# Patient Record
Sex: Male | Born: 1990 | Race: White | Hispanic: No | Marital: Single | Smoking: Former smoker
Health system: Southern US, Community
[De-identification: ages and names within clinical notes are randomized; demographics above are authoritative.]

## PROBLEM LIST (undated history)

## (undated) DIAGNOSIS — G43909 Migraine, unspecified, not intractable, without status migrainosus: Secondary | ICD-10-CM

---

## 2013-09-02 ENCOUNTER — Emergency Department (HOSPITAL_BASED_OUTPATIENT_CLINIC_OR_DEPARTMENT_OTHER)
Admission: EM | Admit: 2013-09-02 | Discharge: 2013-09-03 | Disposition: A | Discharge status: Home / Self Care | Payer: BC Managed Care – PPO | Attending: Emergency Medicine | Admitting: Emergency Medicine

## 2013-09-02 ENCOUNTER — Encounter (HOSPITAL_BASED_OUTPATIENT_CLINIC_OR_DEPARTMENT_OTHER): Payer: Self-pay | Admitting: Emergency Medicine

## 2013-09-02 DIAGNOSIS — Z79899 Other long term (current) drug therapy: Secondary | ICD-10-CM | POA: Insufficient documentation

## 2013-09-02 DIAGNOSIS — R1084 Generalized abdominal pain: Secondary | ICD-10-CM | POA: Insufficient documentation

## 2013-09-02 DIAGNOSIS — M545 Low back pain, unspecified: Secondary | ICD-10-CM | POA: Insufficient documentation

## 2013-09-02 DIAGNOSIS — F17200 Nicotine dependence, unspecified, uncomplicated: Secondary | ICD-10-CM | POA: Insufficient documentation

## 2013-09-02 DIAGNOSIS — R109 Unspecified abdominal pain: Secondary | ICD-10-CM

## 2013-09-02 DIAGNOSIS — G43909 Migraine, unspecified, not intractable, without status migrainosus: Secondary | ICD-10-CM | POA: Insufficient documentation

## 2013-09-02 HISTORY — DX: Migraine, unspecified, not intractable, without status migrainosus: G43.909

## 2013-09-02 LAB — URINALYSIS, ROUTINE W REFLEX MICROSCOPIC
Bilirubin Urine: NEGATIVE
Glucose, UA: NEGATIVE mg/dL
Nitrite: NEGATIVE
Protein, ur: 100 mg/dL — AB
Specific Gravity, Urine: 1.009 (ref 1.005–1.030)
Urobilinogen, UA: 0.2 mg/dL (ref 0.0–1.0)
pH: 5.5 (ref 5.0–8.0)

## 2013-09-02 LAB — URINE MICROSCOPIC-ADD ON

## 2013-09-02 MED ORDER — GI COCKTAIL ~~LOC~~
30.0000 mL | Freq: Once | ORAL | Status: AC
  Administered 2013-09-02: 30 mL via ORAL
  Filled 2013-09-02: qty 30

## 2013-09-02 NOTE — ED Provider Notes (Signed)
CSN: [id number]     Arrival date & time 09/02/13  2245 History   None    This chart was scribed for Hanley Seamen, MD by Arlan Organ, ED Scribe. This patient was seen in room MH12/MH12 and the patient's care was started 11:51 PM.   Chief Complaint  Patient presents with  . Abdominal Pain   HPI  HPI Comments: Zachary Evans is a 22 y.o. male who presents to the Emergency Department complaining of gradual onset, gradually worsening, constant generalized abdominal pain that initially started around 5:30 this evening. Pt also reports lower back pain that is brought on when he is lying flat. He currently rates his pain 7/10. He says movements worsens his pain, and nothing makes the pain better. Denies fever, chills, nausea, vomiting, constipation, diarrhea, dysuria, hematuria, SOB, or CP. He denies any previous similar episodes.  Past Medical History  Diagnosis Date  . Migraine    History reviewed. No pertinent past surgical history. History reviewed. No pertinent family history. History  Substance Use Topics  . Smoking status: Current Every Day Smoker  . Smokeless tobacco: Not on file  . Alcohol Use: No    Review of Systems  All other systems reviewed and are negative.    Allergies  Septra  Home Medications   Current Outpatient Rx  Name  Route  Sig  Dispense  Refill  . amitriptyline (ELAVIL) 10 MG tablet   Oral   Take 30 mg by mouth at bedtime.         . nadolol (CORGARD) 80 MG tablet   Oral   Take 80 mg by mouth daily.          Triage Vitals: BP 144/86  Pulse 67  Temp(Src) 98.9 F (37.2 C) (Oral)  Resp 16  Wt 128 lb (58.06 kg)  SpO2 100%  Physical Exam  General: Well-developed, well-nourished male in no acute distress; appearance consistent with age of record HENT: normocephalic; atraumatic Eyes: pupils equal, round and reactive to light; extraocular muscles intact Neck: supple Heart: regular rate and rhythm; no murmurs, rubs or gallops Lungs: clear to  auscultation bilaterally Abdomen: soft; nondistended; no masses or hepatosplenomegaly; bowel sounds present; tenderness to upper abdomen greater in LUQ Extremities: No deformity; full range of motion; pulses normal Neurologic: Awake, alert and oriented; motor function intact in all extremities and symmetric; no facial droop Skin: Warm and dry Psychiatric: Normal mood and affect   ED Course  Procedures (including critical care time)  DIAGNOSTIC STUDIES: Oxygen Saturation is 100% on RA, Normal by my interpretation.    COORDINATION OF CARE: 11:46 PM- Will order urinalysis. Discussed treatment plan with pt at bedside and pt agreed to plan.      MDM   Nursing notes and vitals signs, including pulse oximetry, reviewed.  Summary of this visit's results, reviewed by myself:  Labs:  Results for orders placed during the hospital encounter of 09/02/13 (from the past 24 hour(s))  URINALYSIS, ROUTINE W REFLEX MICROSCOPIC     Status: Abnormal   Collection Time    09/02/13 10:57 PM      Result Value Range   Color, Urine YELLOW  YELLOW   APPearance CLEAR  CLEAR   Specific Gravity, Urine 1.009  1.005 - 1.030   pH 5.5  5.0 - 8.0   Glucose, UA NEGATIVE  NEGATIVE mg/dL   Hgb urine dipstick SMALL (*) NEGATIVE   Bilirubin Urine NEGATIVE  NEGATIVE   Ketones, ur NEGATIVE  NEGATIVE mg/dL  Protein, ur 100 (*) NEGATIVE mg/dL   Urobilinogen, UA 0.2  0.0 - 1.0 mg/dL   Nitrite NEGATIVE  NEGATIVE   Leukocytes, UA NEGATIVE  NEGATIVE  URINE MICROSCOPIC-ADD ON     Status: Abnormal   Collection Time    09/02/13 10:57 PM      Result Value Range   Squamous Epithelial / LPF FEW (*) RARE   WBC, UA 0-2  <3 WBC/hpf   RBC / HPF 3-6  <3 RBC/hpf   Bacteria, UA FEW (*) RARE   Casts HYALINE CASTS (*) NEGATIVE  LIPASE, BLOOD     Status: None   Collection Time    09/03/13 12:35 AM      Result Value Range   Lipase 20  11 - 59 U/L  COMPREHENSIVE METABOLIC PANEL     Status: Abnormal   Collection Time     09/03/13 12:35 AM      Result Value Range   Sodium 139  135 - 145 mEq/L   Potassium 4.0  3.5 - 5.1 mEq/L   Chloride 102  96 - 112 mEq/L   CO2 27  19 - 32 mEq/L   Glucose, Bld 115 (*) 70 - 99 mg/dL   BUN 14  6 - 23 mg/dL   Creatinine, Ser 8.65  0.50 - 1.35 mg/dL   Calcium 78.4  8.4 - 69.6 mg/dL   Total Protein 7.1  6.0 - 8.3 g/dL   Albumin 4.3  3.5 - 5.2 g/dL   AST 17  0 - 37 U/L   ALT 17  0 - 53 U/L   Alkaline Phosphatase 61  39 - 117 U/L   Total Bilirubin 0.2 (*) 0.3 - 1.2 mg/dL   GFR calc non Af Amer 77 (*) >90 mL/min   GFR calc Af Amer 89 (*) >90 mL/min  CBC WITH DIFFERENTIAL     Status: Abnormal   Collection Time    09/03/13 12:35 AM      Result Value Range   WBC 8.9  4.0 - 10.5 K/uL   RBC 4.57  4.22 - 5.81 MIL/uL   Hemoglobin 13.4  13.0 - 17.0 g/dL   HCT 29.5  28.4 - 13.2 %   MCV 86.4  78.0 - 100.0 fL   MCH 29.3  26.0 - 34.0 pg   MCHC 33.9  30.0 - 36.0 g/dL   RDW 44.0  10.2 - 72.5 %   Platelets 146 (*) 150 - 400 K/uL   Neutrophils Relative % 69  43 - 77 %   Neutro Abs 6.2  1.7 - 7.7 K/uL   Lymphocytes Relative 20  12 - 46 %   Lymphs Abs 1.8  0.7 - 4.0 K/uL   Monocytes Relative 10  3 - 12 %   Monocytes Absolute 0.9  0.1 - 1.0 K/uL   Eosinophils Relative 1  0 - 5 %   Eosinophils Absolute 0.1  0.0 - 0.7 K/uL   Basophils Relative 0  0 - 1 %   Basophils Absolute 0.0  0.0 - 0.1 K/uL   12:27 AM No change with GI cocktail.  Imaging Studies: Ct Abdomen Pelvis W Contrast  09/03/2013   CLINICAL DATA:  Generalized abdominal pain  EXAM: CT ABDOMEN AND PELVIS WITH CONTRAST  TECHNIQUE: Multidetector CT imaging of the abdomen and pelvis was performed using the standard protocol following bolus administration of intravenous contrast.  CONTRAST:  50mL OMNIPAQUE IOHEXOL 300 MG/ML SOLN, OMNIPAQUE IOHEXOL 300 MG/ML SOLN  COMPARISON:  None.  FINDINGS: The visualized lung bases are clear.  The liver demonstrates a normal contrast enhanced appearance. No focal intrahepatic lesions.  Gallbladder is normal. No biliary ductal dilatation. The spleen, adrenal glands, and pancreas demonstrate a normal contrast enhanced appearance.  The kidneys are equal in size with symmetric enhancement. No nephrolithiasis, hydronephrosis, or focal enhancing renal mass identified.  There is no evidence of bowel obstruction. The stomach, duodenum, and small bowel are unremarkable. The colon is within normal limits without evidence of abnormal wall thickening, mucosal enhancement, or inflammatory fat stranding. The appendix is visualize in the right lower quadrant and is of normal caliber and appearance without associated inflammatory changes to suggest acute appendicitis.  Bladder is well distended and normal in appearance. Prostate is unremarkable.  No free air or fluid identified. No pathologically enlarged intra-abdominal or pelvic lymph nodes are seen.  No acute osseous abnormality identified. No focal lytic or blastic osseous lesions.  IMPRESSION: 1. No CT evidence of acute intra-abdominal or pelvic process. 2. Normal appendix.   Electronically Signed   By: Rise Mu M.D.   On: 09/03/2013 03:19   3:25 AM Patient advised of essentially normal CT and laboratory studies. A specific cause of his abdominal pain is not apparent. His pain has improved in the ED and is now principally epigastric; he has some mild epigastric tenderness remaining.   I personally performed the services described in this documentation, which was scribed in my presence.  The recorded information has been reviewed and is accurate.  Hanley Seamen, MD 09/03/13 [phone]

## 2013-09-02 NOTE — ED Notes (Signed)
Generalized abd pain since 5:30 pm, pt states he now has pain in his back as well. Denies n/v/d. Denies any urinary sxs.

## 2013-09-03 ENCOUNTER — Emergency Department (HOSPITAL_BASED_OUTPATIENT_CLINIC_OR_DEPARTMENT_OTHER): Payer: BC Managed Care – PPO

## 2013-09-03 LAB — CBC WITH DIFFERENTIAL/PLATELET
Eosinophils Absolute: 0.1 10*3/uL (ref 0.0–0.7)
Eosinophils Relative: 1 % (ref 0–5)
HCT: 39.5 % (ref 39.0–52.0)
Lymphocytes Relative: 20 % (ref 12–46)
Lymphs Abs: 1.8 10*3/uL (ref 0.7–4.0)
MCH: 29.3 pg (ref 26.0–34.0)
MCHC: 33.9 g/dL (ref 30.0–36.0)
MCV: 86.4 fL (ref 78.0–100.0)
Monocytes Absolute: 0.9 10*3/uL (ref 0.1–1.0)
Monocytes Relative: 10 % (ref 3–12)
Platelets: 146 10*3/uL — ABNORMAL LOW (ref 150–400)
RBC: 4.57 MIL/uL (ref 4.22–5.81)
WBC: 8.9 10*3/uL (ref 4.0–10.5)

## 2013-09-03 LAB — COMPREHENSIVE METABOLIC PANEL
ALT: 17 U/L (ref 0–53)
AST: 17 U/L (ref 0–37)
Albumin: 4.3 g/dL (ref 3.5–5.2)
Chloride: 102 mEq/L (ref 96–112)
Creatinine, Ser: 1.3 mg/dL (ref 0.50–1.35)
Potassium: 4 mEq/L (ref 3.5–5.1)
Total Bilirubin: 0.2 mg/dL — ABNORMAL LOW (ref 0.3–1.2)
Total Protein: 7.1 g/dL (ref 6.0–8.3)

## 2013-09-03 LAB — LIPASE, BLOOD: Lipase: 20 U/L (ref 11–59)

## 2013-09-03 MED ORDER — PANTOPRAZOLE SODIUM 40 MG IV SOLR
40.0000 mg | Freq: Once | INTRAVENOUS | Status: AC
  Administered 2013-09-03: 40 mg via INTRAVENOUS
  Filled 2013-09-03: qty 40

## 2013-09-03 MED ORDER — ONDANSETRON HCL 4 MG/2ML IJ SOLN
INTRAMUSCULAR | Status: AC
  Filled 2013-09-03: qty 2

## 2013-09-03 MED ORDER — FENTANYL CITRATE 0.05 MG/ML IJ SOLN
50.0000 ug | Freq: Once | INTRAMUSCULAR | Status: AC
  Administered 2013-09-03: 50 ug via INTRAVENOUS
  Filled 2013-09-03: qty 2

## 2013-09-03 MED ORDER — IOHEXOL 300 MG/ML  SOLN
50.0000 mL | Freq: Once | INTRAMUSCULAR | Status: AC | PRN
  Administered 2013-09-03: 50 mL via ORAL

## 2013-09-03 MED ORDER — IOHEXOL 300 MG/ML  SOLN
100.0000 mL | Freq: Once | INTRAMUSCULAR | Status: AC | PRN
  Administered 2013-09-03: 100 mL via INTRAVENOUS

## 2013-09-03 MED ORDER — ONDANSETRON HCL 4 MG/2ML IJ SOLN
4.0000 mg | Freq: Once | INTRAMUSCULAR | Status: AC
  Administered 2013-09-03: 4 mg via INTRAVENOUS

## 2013-09-03 MED ORDER — SODIUM CHLORIDE 0.9 % IV SOLN
INTRAVENOUS | Status: DC
Start: 2013-09-03 — End: 2013-09-03
  Administered 2013-09-03: 01:00:00 via INTRAVENOUS

## 2015-04-19 ENCOUNTER — Emergency Department
Admission: EM | Admit: 2015-04-19 | Discharge: 2015-04-19 | Disposition: A | Discharge status: Home / Self Care | Payer: BLUE CROSS/BLUE SHIELD | Source: Home / Self Care | Attending: Family Medicine | Admitting: Family Medicine

## 2015-04-19 ENCOUNTER — Encounter: Payer: Self-pay | Admitting: Emergency Medicine

## 2015-04-19 DIAGNOSIS — J029 Acute pharyngitis, unspecified: Secondary | ICD-10-CM | POA: Diagnosis not present

## 2015-04-19 LAB — POCT RAPID STREP A (OFFICE): Rapid Strep A Screen: NEGATIVE

## 2015-04-19 MED ORDER — PENICILLIN V POTASSIUM 500 MG PO TABS: ORAL_TABLET | ORAL | Status: DC

## 2015-04-19 NOTE — ED Notes (Signed)
Patient presents to [facility] with fever 103.1 headache, lower back pain and sore throat. Rates pain 8/10 headache

## 2015-04-19 NOTE — Discharge Instructions (Signed)
Try warm salt water gargles for sore throat.  May take Ibuprofen , 4 tabs every 8 hours with food for sore throat, fever, headache, etc.   Salt Water Gargle This solution will help make your mouth and throat feel better. HOME CARE INSTRUCTIONS   Mix 1 teaspoon of salt in 8 ounces of warm water.  Gargle with this solution as much or often as you need or as directed. Swish and gargle gently if you have any sores or wounds in your mouth.  Do not swallow this mixture. Document Released: 06/03/2004 Document Revised: 11/22/2011 Document Reviewed: 10/25/2008 [facility] Patient Information 2015 [place], [place]. This information is not intended to replace advice given to you by your health care provider. Make sure you discuss any questions you have with your health care provider.

## 2015-04-19 NOTE — ED Provider Notes (Signed)
CSN: [id number]     Arrival date & time 04/19/15  1546 History   First MD Initiated Contact with Patient 04/19/15 1616     Chief Complaint  Patient presents with  . Sore Throat  . Fever  . Migraine      HPI Comments: At about 8pm yesterday patient developed myalgias, fatigue, and chills.  Later in the night he developed a headache, and today he has developed sore throat with fever to 103.1.  No cough or nasal congestion.  The history is provided by the patient.    Past Medical History  Diagnosis Date  . Migraine    History reviewed. No pertinent past surgical history. History reviewed. No pertinent family history. History  Substance Use Topics  . Smoking status: Current Every Day Smoker  . Smokeless tobacco: Not on file  . Alcohol Use: Yes    Review of Systems + sore throat No cough No pleuritic pain No wheezing No nasal congestion No post-nasal drainage No sinus pain/pressure No itchy/red eyes No earache + dizziness No hemoptysis No SOB + fever, + chills + nausea No vomiting No abdominal pain No diarrhea No urinary symptoms No skin rash + fatigue + myalgias + headache Used OTC meds without relief  Allergies  Septra  Home Medications   Prior to Admission medications   Medication Sig Start Date End Date Taking? Authorizing Provider  amitriptyline (ELAVIL) 10 MG tablet Take 30 mg by mouth at bedtime.    Historical Provider, MD  nadolol (CORGARD) 80 MG tablet Take 80 mg by mouth daily.    Historical Provider, MD  penicillin v potassium (VEETID) 500 MG tablet Take one tab by mouth twice daily for 10 days 04/19/15   Lattie Haw, MD   BP 117/79 mmHg  Pulse 105  Temp(Src) 103.1 F (39.5 C) (Oral)  Resp 18  Ht  (1.702 m)  Wt 145 lb (65.772 kg)  BMI 22.71 kg/m2  SpO2 97% Physical Exam Nursing notes and Vital Signs reviewed. Appearance:  Patient appears stated age, and in no acute distress Eyes:  Pupils are equal, round, and reactive to light and  accomodation.  Extraocular movement is intact.  Conjunctivae are not inflamed  Ears:  Canals normal.  Tympanic membranes normal.  Nose:  Mildly congested turbinates.  No sinus tenderness.  Pharynx:  Erythematous and slightly swollen without obstruction.  Uvula is slightly edematous and erythematous Neck:  Supple.  Nontender enlarged anterior nodes are palpated Lungs:  Clear to auscultation.  Breath sounds are equal.  Moving air well. Heart:  Regular rate and rhythm without murmurs, rubs, or gallops.  Abdomen:  Nontender without masses or hepatosplenomegaly.  Bowel sounds are present.  No CVA or flank tenderness.  Extremities:  No edema.   Skin:  No rash present.   ED Course  Procedures  None    Labs Reviewed  POCT RAPID STREP A (OFFICE) negative        MDM   1. Acute pharyngitis, unspecified pharyngitis type    Nurse notes that throat specimen difficult to obtain because of patient's gag reflex. Throat culture pending. With a CENTOR score of 4, patient qualifies for empiric treatment for strep pharyngitis. Begin PenVK  BID for 10 days. Try warm salt water gargles for sore throat.  May take Ibuprofen , 4 tabs every 8 hours with food for sore throat, fever, headache, etc. Followup with Family Doctor if not improved in 7 to 10 days.    Lattie Haw,  MD 04/20/15 1922

## 2015-04-20 LAB — STREP A DNA PROBE: GASP: POSITIVE

## 2015-04-21 ENCOUNTER — Telehealth: Payer: Self-pay | Admitting: *Deleted

## 2015-05-02 ENCOUNTER — Emergency Department
Admission: EM | Admit: 2015-05-02 | Discharge: 2015-05-02 | Disposition: A | Discharge status: Home / Self Care | Payer: BLUE CROSS/BLUE SHIELD | Source: Home / Self Care | Attending: Family Medicine | Admitting: Family Medicine

## 2015-05-02 ENCOUNTER — Encounter: Payer: Self-pay | Admitting: Emergency Medicine

## 2015-05-02 ENCOUNTER — Emergency Department (INDEPENDENT_AMBULATORY_CARE_PROVIDER_SITE_OTHER): Payer: BLUE CROSS/BLUE SHIELD

## 2015-05-02 DIAGNOSIS — N432 Other hydrocele: Secondary | ICD-10-CM

## 2015-05-02 DIAGNOSIS — R52 Pain, unspecified: Secondary | ICD-10-CM

## 2015-05-02 DIAGNOSIS — I861 Scrotal varices: Secondary | ICD-10-CM

## 2015-05-02 DIAGNOSIS — N508 Other specified disorders of male genital organs: Secondary | ICD-10-CM

## 2015-05-02 DIAGNOSIS — N451 Epididymitis: Secondary | ICD-10-CM

## 2015-05-02 DIAGNOSIS — N50811 Right testicular pain: Secondary | ICD-10-CM

## 2015-05-02 LAB — POCT URINALYSIS DIP (MANUAL ENTRY)
BILIRUBIN UA: NEGATIVE
GLUCOSE UA: NEGATIVE
Ketones, POC UA: NEGATIVE
Leukocytes, UA: NEGATIVE
NITRITE UA: NEGATIVE
Protein Ur, POC: NEGATIVE
Spec Grav, UA: 1.025 (ref 1.005–1.03)
UROBILINOGEN UA: 0.2 (ref 0–1)
pH, UA: 7 (ref 5–8)

## 2015-05-02 LAB — POCT CBC W AUTO DIFF (K'VILLE URGENT CARE)

## 2015-05-02 MED ORDER — CEFTRIAXONE SODIUM 250 MG IJ SOLR
250.0000 mg | Freq: Once | INTRAMUSCULAR | Status: AC
  Administered 2015-05-02: 250 mg via INTRAMUSCULAR

## 2015-05-02 MED ORDER — LEVOFLOXACIN 500 MG PO TABS: 500.0000 mg | ORAL_TABLET | Freq: Every day | ORAL | Status: AC

## 2015-05-02 NOTE — ED Notes (Signed)
Right testicle pain and swelling, lower abdominal pain started yesterday morning. Denies new sexual partners, polyuria, dysuria, or nausea.

## 2015-05-02 NOTE — ED Provider Notes (Signed)
CSN: [id number]     Arrival date & time 05/02/15  0802 History   First MD Initiated Contact with Patient 05/02/15 0848     Chief Complaint  Patient presents with  . Testicle Pain      HPI Comments: Patient awoke yesterday with pain in his right scrotum.  Today he noted swelling of his scrotum with erythema and pain radiating to the right groin.  He has had "cold sweats" but no fever.  He denies dysuria, frequency, urgency, or urethral discharge.  No new sexual partners.  Patient is a 24 y.o. male presenting with testicular pain. The history is provided by the patient.  Testicle Pain This is a new problem. The current episode started yesterday. The problem occurs constantly. The problem has been gradually worsening. Associated symptoms include abdominal pain. Pertinent negatives include no headaches. The symptoms are aggravated by walking. Nothing relieves the symptoms. He has tried nothing for the symptoms.    Past Medical History  Diagnosis Date  . Migraine    History reviewed. No pertinent past surgical history. No family history on file. Social History  Substance Use Topics  . Smoking status: Current Every Day Smoker  . Smokeless tobacco: None  . Alcohol Use: Yes    Review of Systems  Constitutional: Positive for diaphoresis. Negative for fever, chills and fatigue.  HENT: Negative.   Eyes: Negative.   Respiratory: Negative.   Cardiovascular: Negative.   Gastrointestinal: Positive for abdominal pain. Negative for nausea and abdominal distention.  Genitourinary: Positive for scrotal swelling and testicular pain. Negative for dysuria, urgency, frequency, hematuria, flank pain, decreased urine volume, discharge, difficulty urinating and genital sores.  Musculoskeletal: Negative.   Skin: Negative for rash.  Neurological: Negative for headaches.    Allergies  Septra  Home Medications   Prior to Admission medications   Medication Sig Start Date End Date Taking? Authorizing  Provider  amitriptyline (ELAVIL) 10 MG tablet Take 30 mg by mouth at bedtime.    Historical Provider, MD  levofloxacin (LEVAQUIN) 500 MG tablet Take 1 tablet (500 mg total) by mouth daily. 05/02/15   Lattie Haw, MD  nadolol (CORGARD) 80 MG tablet Take 80 mg by mouth daily.    Historical Provider, MD   BP 131/89 mmHg  Pulse 107  Temp(Src) 98.4 F (36.9 C) (Oral)  Ht  (1.702 m)  Wt 145 lb (65.772 kg)  BMI 22.71 kg/m2  SpO2 99% Physical Exam Nursing notes and Vital Signs reviewed. Appearance:  Patient appears stated age, and in no acute distress Eyes:  Pupils are equal, round, and reactive to light and accomodation.  Extraocular movement is intact.  Conjunctivae are not inflamed  Pharynx:  Normal; moist mucous membranes  Neck:  Supple.  No adenopathy Lungs:  Clear to auscultation.  Breath sounds are equal.  Moving air well. Heart:  Regular rate and rhythm without murmurs, rubs, or gallops.  Abdomen:  Nontender without masses or hepatosplenomegaly.  Bowel sounds are present.  No CVA or flank tenderness.  Extremities:  No edema.    Skin:  No rash present.  Genitourinary:  Penis normal without lesions or urethral discharge.  Scrotum is mildly erythematous with minimal swelling.  Testes are descended bilaterally.  Right testicle has diffuse tenderness to palpation, especially over the epididymus.  No evidence of a direct hernia, but unable to adequately examine for indirect hernia.  No regional lymphadenopathy palpated.  Cremasteric reflex +/-   ED Course  Procedures  None  Labs Reviewed  POCT URINALYSIS DIP (MANUAL ENTRY) - Abnormal; Notable for the following:    Blood, UA trace-intact (*)    All other components within normal limits  URINE CULTURE  POCT CBC W AUTO DIFF ([facility]):  WBC 19.2; LY 9.1; MO 6.0; GR 84.9; Hgb 14.9; Platelets 275     Imaging Review US Scrotum  05/02/2015   CLINICAL DATA:  Right testicular pain.  EXAM: SCROTAL ULTRASOUND  DOPPLER  ULTRASOUND OF THE TESTICLES  TECHNIQUE: Complete ultrasound examination of the testicles, epididymis, and other scrotal structures was performed. Color and spectral Doppler ultrasound were also utilized to evaluate blood flow to the testicles.  COMPARISON:  None.  FINDINGS: Right testicle  Measurements: 4.4 x 2.4 x 2.5 cm. No mass or microlithiasis visualized.  Left testicle  Measurements: 3.9 x 2.2 x 2.7 cm. No mass or microlithiasis visualized.  Right epididymis:  Normal in size and appearance.  Left epididymis:  Normal in size and appearance.  Hydrocele:  Bilateral small hydroceles.  Varicocele:  Bilateral small varicoceles.  Pulsed Doppler interrogation of both testes demonstrates normal low resistance arterial and venous waveforms bilaterally.  IMPRESSION: 1. Bilateral small hydroceles and varicoceles. 2. No evidence of testicular torsion or testicular mass lesion.   Electronically Signed   By: Maisie Fus  Register   On: 05/02/2015 10:26   [place] Art/ven Flow Abd Pelv Doppler  05/02/2015   CLINICAL DATA:  Right testicular pain.  EXAM: SCROTAL ULTRASOUND  DOPPLER ULTRASOUND OF THE TESTICLES  TECHNIQUE: Complete ultrasound examination of the testicles, epididymis, and other scrotal structures was performed. Color and spectral Doppler ultrasound were also utilized to evaluate blood flow to the testicles.  COMPARISON:  None.  FINDINGS: Right testicle  Measurements: 4.4 x 2.4 x 2.5 cm. No mass or microlithiasis visualized.  Left testicle  Measurements: 3.9 x 2.2 x 2.7 cm. No mass or microlithiasis visualized.  Right epididymis:  Normal in size and appearance.  Left epididymis:  Normal in size and appearance.  Hydrocele:  Bilateral small hydroceles.  Varicocele:  Bilateral small varicoceles.  Pulsed Doppler interrogation of both testes demonstrates normal low resistance arterial and venous waveforms bilaterally.  IMPRESSION: 1. Bilateral small hydroceles and varicoceles. 2. No evidence of testicular torsion or testicular  mass lesion.   Electronically Signed   By: Maisie Fus  Register   On: 05/02/2015 10:26     MDM   1. Testicular pain, right           2 Epididymitis, right    Urine culture pending.  GC/chlamydia pending/ Rocephin 250mg  IM.  Levaquin 500mg  daily for 10 days. Followup with urologist if not improving one week. If symptoms become significantly worse during the night or over the weekend, proceed to the local emergency room.      Lattie Haw, MD 05/04/15 [phone]

## 2015-05-02 NOTE — Discharge Instructions (Signed)
If symptoms become significantly worse during the night or over the weekend, proceed to the local emergency room.    Epididymitis Epididymitis is a swelling (inflammation) of the epididymis. The epididymis is a cord-like structure along the back part of the testicle. Epididymitis is usually, but not always, caused by infection. This is usually a sudden problem beginning with chills, fever and pain behind the scrotum and in the testicle. There may be swelling and redness of the testicle. DIAGNOSIS  Physical examination will reveal a tender, swollen epididymis. Sometimes, cultures are obtained from the urine or from prostate secretions to help find out if there is an infection or if the cause is a different problem. Sometimes, blood work is performed to see if your white blood cell count is elevated and if a germ (bacterial) or viral infection is present. Using this knowledge, an appropriate medicine which kills germs (antibiotic) can be chosen by your caregiver. A viral infection causing epididymitis will most often go away (resolve) without treatment. HOME CARE INSTRUCTIONS   Hot sitz baths for 20 minutes, 4 times per day, may help relieve pain.  Only take over-the-counter or prescription medicines for pain, discomfort or fever as directed by your caregiver.  Take all medicines, including antibiotics, as directed. Take the antibiotics for the full prescribed length of time even if you are feeling better.  It is very important to keep all follow-up appointments. SEEK IMMEDIATE MEDICAL CARE IF:   You have a fever.  You have pain not relieved with medicines.  You have any worsening of your problems.  Your pain seems to come and go.  You develop pain, redness, and swelling in the scrotum and surrounding areas. MAKE SURE YOU:   Understand these instructions.  Will watch your condition.  Will get help right away if you are not doing well or get worse. Document Released: 08/27/2000  Document Revised: 11/22/2011 Document Reviewed: 07/17/2009 [facility] Patient Information 2015 [place], [place]. This information is not intended to replace advice given to you by your health care provider. Make sure you discuss any questions you have with your health care provider.

## 2015-05-04 ENCOUNTER — Telehealth: Payer: Self-pay | Admitting: Emergency Medicine

## 2015-05-04 LAB — URINE CULTURE
COLONY COUNT: NO GROWTH
Organism ID, Bacteria: NO GROWTH

## 2015-06-21 ENCOUNTER — Emergency Department
Admission: EM | Admit: 2015-06-21 | Discharge: 2015-06-21 | Disposition: A | Discharge status: Other Acute Inpatient Hospital | Payer: BLUE CROSS/BLUE SHIELD | Source: Home / Self Care

## 2015-09-23 ENCOUNTER — Emergency Department (INDEPENDENT_AMBULATORY_CARE_PROVIDER_SITE_OTHER): Payer: Managed Care, Other (non HMO)

## 2015-09-23 ENCOUNTER — Encounter: Payer: Self-pay | Admitting: Emergency Medicine

## 2015-09-23 ENCOUNTER — Emergency Department (INDEPENDENT_AMBULATORY_CARE_PROVIDER_SITE_OTHER)
Admission: EM | Admit: 2015-09-23 | Discharge: 2015-09-23 | Disposition: A | Discharge status: Home / Self Care | Payer: Managed Care, Other (non HMO) | Source: Home / Self Care | Attending: Family Medicine | Admitting: Family Medicine

## 2015-09-23 DIAGNOSIS — R109 Unspecified abdominal pain: Secondary | ICD-10-CM | POA: Diagnosis not present

## 2015-09-23 DIAGNOSIS — R3129 Other microscopic hematuria: Secondary | ICD-10-CM

## 2015-09-23 LAB — POCT CBC W AUTO DIFF (K'VILLE URGENT CARE)

## 2015-09-23 LAB — COMPLETE METABOLIC PANEL WITH GFR
ALT: 39 U/L (ref 9–46)
AST: 22 U/L (ref 10–40)
Albumin: 4.8 g/dL (ref 3.6–5.1)
Alkaline Phosphatase: 41 U/L (ref 40–115)
BUN: 11 mg/dL (ref 7–25)
CO2: 26 mmol/L (ref 20–31)
Calcium: 10.1 mg/dL (ref 8.6–10.3)
Chloride: 103 mmol/L (ref 98–110)
Creat: 0.78 mg/dL (ref 0.60–1.35)
GFR, Est African American: >89 mL/min (ref >=60)
GFR, Est Non African American: >89 mL/min (ref >=60)
Glucose, Bld: 88 mg/dL (ref 65–99)
Potassium: 5 mmol/L (ref 3.5–5.3)
Sodium: 139 mmol/L (ref 135–146)
Total Bilirubin: 0.4 mg/dL (ref 0.2–1.2)
Total Protein: 7.3 g/dL (ref 6.1–8.1)

## 2015-09-23 LAB — POCT URINALYSIS DIP (MANUAL ENTRY)
Bilirubin, UA: NEGATIVE
Glucose, UA: NEGATIVE
Ketones, POC UA: NEGATIVE
Leukocytes, UA: NEGATIVE
Nitrite, UA: NEGATIVE
Protein Ur, POC: NEGATIVE
Spec Grav, UA: 1.025 (ref 1.005–1.03)
Urobilinogen, UA: 0.2 (ref 0–1)
pH, UA: 6.5 (ref 5–8)

## 2015-09-23 MED ORDER — IBUPROFEN 800 MG PO TABS: 800.0000 mg | ORAL_TABLET | Freq: Three times a day (TID) | ORAL | Status: AC

## 2015-09-23 NOTE — ED Notes (Signed)
Lower left side pain radiates into lower pelvis x 4 days, urgency

## 2015-09-23 NOTE — ED Provider Notes (Signed)
CSN: [id number]     Arrival date & time 09/23/15  0809 History   First MD Initiated Contact with Patient 09/23/15 [phone]     Chief Complaint  Patient presents with  . Abdominal Pain   (Consider location/radiation/quality/duration/timing/severity/associated sxs/prior Treatment) HPI  Pt is a 25yo male presenting to [facility] with c/o Right sided flank pain that is radiating from his mid back to his Right lower abdomen.  Pain is dull and sharp in nature, 9/10 at worst.  He has not had any pain with urination and denies blood in his urine. He was seen by a urologist within the last 2 months for scrotal pain and had been treated with antibiotics for epididymitis but was advised some of the pain is likely coming from his spine as pain occasionally goes down his Right leg. Denies loss of bowel or bladder. Denies fever, chills, n/v/d. Denies personal hx of kidney stones but states his dad has hx of kidney stones.   Past Medical History  Diagnosis Date  . Migraine    History reviewed. No pertinent past surgical history. Family History  Problem Relation Age of Onset  . Migraines Mother   . Hypertension Father   . Diabetes Father    Social History  Substance Use Topics  . Smoking status: Current Every Day Smoker  . Smokeless tobacco: None  . Alcohol Use: Yes    Review of Systems  Constitutional: Negative for fever and chills.  Gastrointestinal: Positive for abdominal pain (Right side). Negative for nausea, vomiting, diarrhea and constipation.  Genitourinary: Positive for frequency and flank pain (Right). Negative for dysuria, urgency, hematuria, decreased urine volume, scrotal swelling and testicular pain.  Musculoskeletal: Positive for joint swelling and arthralgias.       Body aches    Allergies  Septra  Home Medications   Prior to Admission medications   Medication Sig Start Date End Date Taking? Authorizing Provider  amitriptyline (ELAVIL) 10 MG tablet Take 30 mg by mouth at bedtime.     Historical Provider, MD  ibuprofen (ADVIL,MOTRIN) 800 MG tablet Take 1 tablet (800 mg total) by mouth 3 (three) times daily. 09/23/15   Junius Finner, PA-C  levofloxacin (LEVAQUIN) 500 MG tablet Take 1 tablet (500 mg total) by mouth daily. 05/02/15   Lattie Haw, MD  nadolol (CORGARD) 80 MG tablet Take 80 mg by mouth daily.    Historical Provider, MD   Meds Ordered and Administered this Visit  Medications - No data to display  BP 133/77 mmHg  Pulse 54  Temp(Src) 98.3 F (36.8 C) (Oral)  Ht 5\' 7"  (1.702 m)  Wt 150 lb (68.04 kg)  BMI 23.49 kg/m2  SpO2 100% No data found.   Physical Exam  Constitutional: He appears well-developed and well-nourished.  HENT:  Head: Normocephalic and atraumatic.  Eyes: Conjunctivae are normal. No scleral icterus.  Neck: Normal range of motion.  Cardiovascular: Regular rhythm and normal heart sounds.  Bradycardia present.   Mild bradycardia, regular rhythm  Pulmonary/Chest: Effort normal and breath sounds normal. No respiratory distress. He has no wheezes. He has no rales. He exhibits no tenderness.  Abdominal: Soft. He exhibits no distension and no mass. There is no tenderness. There is CVA tenderness ( mild, Right side). There is no rebound and no guarding.  Musculoskeletal: Normal range of motion.  No midline spinal tenderness or muscular tenderness. Full ROM upper and lower extremities with 5/5 strength bilaterally.   Neurological: He is alert.  Skin: Skin is warm and  dry.  Nursing note and vitals reviewed.   ED Course  Procedures (including critical care time)  Labs Review Labs Reviewed  COMPLETE METABOLIC PANEL WITH GFR  POCT CBC W AUTO DIFF ([facility])    Imaging Review [place] Renal  09/23/2015  CLINICAL DATA:  Right flank pain extending into the pelvis for 4 days. Urinary frequency and hematuria. EXAM: RENAL / URINARY TRACT ULTRASOUND COMPLETE COMPARISON:  CT abdomen and pelvis 09/03/2013 FINDINGS: Right Kidney: Length: 10.4  cm, within normal limits. Echogenicity within normal limits. No mass or hydronephrosis visualized. Left Kidney: Length: 10.9 cm, within normal limits. Echogenicity within normal limits. No mass or hydronephrosis visualized. Bladder: Appears normal for degree of bladder distention. IMPRESSION: Negative bilateral renal ultrasound. Electronically Signed   By: Marin Robertshristopher  Mattern M.D.   On: 09/23/2015 09:21     MDM   1. Right flank pain   2. Right sided abdominal pain   3. Hematuria, microscopic    Pt is a 25yo male c/o Right sided flank pain. Pt appears well, non-toxic. Is afebrile. He has been seeing urology for intermittent testicular pain and another provider for back pain. Medical records reviewed.  Pt does have hs of microscopic hematuria but pt states this Right flank pain is new. No hx of renal stones.  Renal U/S: normal Reassured pt of normal ultrasound. Will get CBC and CMP for further workup.  CBC- WNL, no evidence of underlying infection.   CMP- pending.  No obvious source of reported pain.  Pain could have been due to a smaller renal stone that has already passed, referred pain from back.  Low concern for emergent process taking place at this time including cholecystitis or appendicitis.   Rx tramadol and ibuprofen.    Encouraged to f/u with PCP in 1 week if not improving, sooner if worsening. Patient verbalized understanding and agreement with treatment plan.     Junius FinnerErin O'Malley, PA-C 09/23/15 [phone]

## 2015-09-24 ENCOUNTER — Telehealth: Payer: Self-pay | Admitting: *Deleted

## 2015-09-24 LAB — URINE CULTURE
Colony Count: NO GROWTH
Organism ID, Bacteria: NO GROWTH

## 2016-10-21 ENCOUNTER — Emergency Department (HOSPITAL_BASED_OUTPATIENT_CLINIC_OR_DEPARTMENT_OTHER)
Admission: EM | Admit: 2016-10-21 | Discharge: 2016-10-22 | Disposition: A | Discharge status: Home / Self Care | Payer: Managed Care, Other (non HMO) | Attending: Emergency Medicine | Admitting: Emergency Medicine

## 2016-10-21 ENCOUNTER — Encounter (HOSPITAL_BASED_OUTPATIENT_CLINIC_OR_DEPARTMENT_OTHER): Payer: Self-pay

## 2016-10-21 ENCOUNTER — Emergency Department (HOSPITAL_BASED_OUTPATIENT_CLINIC_OR_DEPARTMENT_OTHER): Payer: Managed Care, Other (non HMO)

## 2016-10-21 DIAGNOSIS — R0789 Other chest pain: Secondary | ICD-10-CM | POA: Diagnosis present

## 2016-10-21 DIAGNOSIS — R42 Dizziness and giddiness: Secondary | ICD-10-CM | POA: Insufficient documentation

## 2016-10-21 DIAGNOSIS — R079 Chest pain, unspecified: Secondary | ICD-10-CM

## 2016-10-21 DIAGNOSIS — F17200 Nicotine dependence, unspecified, uncomplicated: Secondary | ICD-10-CM | POA: Diagnosis not present

## 2016-10-21 LAB — CBC WITH DIFFERENTIAL/PLATELET
BASOS ABS: 0 10*3/uL (ref 0.0–0.1)
Basophils Relative: 0 %
Eosinophils Absolute: 0.1 10*3/uL (ref 0.0–0.7)
Eosinophils Relative: 2 %
HEMATOCRIT: 42.4 % (ref 39.0–52.0)
HEMOGLOBIN: 13.9 g/dL (ref 13.0–17.0)
LYMPHS PCT: 30 %
Lymphs Abs: 2.4 10*3/uL (ref 0.7–4.0)
MCH: 28.7 pg (ref 26.0–34.0)
MCHC: 32.8 g/dL (ref 30.0–36.0)
MCV: 87.4 fL (ref 78.0–100.0)
MONO ABS: 0.6 10*3/uL (ref 0.1–1.0)
Monocytes Relative: 7 %
NEUTROS ABS: 5 10*3/uL (ref 1.7–7.7)
NEUTROS PCT: 61 %
Platelets: 219 10*3/uL (ref 150–400)
RBC: 4.85 MIL/uL (ref 4.22–5.81)
RDW: 13.1 % (ref 11.5–15.5)
WBC: 8 10*3/uL (ref 4.0–10.5)

## 2016-10-21 LAB — TROPONIN I: Troponin I: <0.03 ng/mL (ref <0.03)

## 2016-10-21 LAB — BASIC METABOLIC PANEL
ANION GAP: 10 (ref 5–15)
BUN: 11 mg/dL (ref 6–20)
CHLORIDE: 103 mmol/L (ref 101–111)
CO2: 27 mmol/L (ref 22–32)
Calcium: 9.5 mg/dL (ref 8.9–10.3)
Creatinine, Ser: 0.86 mg/dL (ref 0.61–1.24)
GFR calc Af Amer: >60 mL/min (ref >60)
GFR calc non Af Amer: >60 mL/min (ref >60)
GLUCOSE: 92 mg/dL (ref 65–99)
POTASSIUM: 3.8 mmol/L (ref 3.5–5.1)
Sodium: 140 mmol/L (ref 135–145)

## 2016-10-21 LAB — D-DIMER, QUANTITATIVE (NOT AT ARMC)

## 2016-10-21 MED ORDER — ASPIRIN 81 MG PO CHEW: 324.0000 mg | CHEWABLE_TABLET | Freq: Once | ORAL | Status: DC

## 2016-10-21 NOTE — ED Triage Notes (Signed)
Pt c/o cental, substernal chest pain and heaviness that started about 10 minutes prior to arrival, pt states it is making it hard for him to get a deep breath, he denies radiation to back or jaw, c/o dry mouth

## 2016-10-21 NOTE — ED Provider Notes (Signed)
[facility] Provider Note   CSN: [id number] Arrival date & time: 10/21/16  1956    By signing my name below, I, Zachary Evans, attest that this documentation has been prepared under the direction and in the presence of Zachary Scaleshristopher J Tegeler, MD. Electronically Signed: Valentino SaxonBianca Evans, ED Scribe. 10/21/16. 8:58 PM.  History   Chief Complaint Chief Complaint  Patient presents with  . Chest Pain   The history is provided by the patient. No language interpreter was used.   HPI Comments: Zachary Evans is a 26 y.o. male who presents to the Emergency Department complaining of 9/10, sudden onset, centralized chest pain onset PTA. He denies recent trauma or injury to the affected area. Pt describes his pain as a heavy feeling. He denies radiation of pain. Pt notes he was at dinner tonight when he began to feel his discomfort. Pt denies consuming any acidic or spicy foods. He notes his pain is exacerbated with deep breathing. Pt reports having ~1-2 episodes of chest pains throughout the week that began about a month ago. He states his pain today is different than his normal baseline. He notes associated dizziness episodes that occur several times throughout the day, but states he began wearing prescription eyewear recently. No alleviating factors noted. Pt notes his pain has subsided from a 9/10 to a 4/10 since arriving. He denies fever, chills, runny nose, cough, leg pain, leg swelling. He also denies hx of blood clots. Pt is a former smoker.   Past Medical History:  Diagnosis Date  . Migraine     There are no active problems to display for this patient.   History reviewed. No pertinent surgical history.     Home Medications    Prior to Admission medications   Medication Sig Start Date End Date Taking? Authorizing Provider  amitriptyline (ELAVIL) 10 MG tablet Take 30 mg by mouth at bedtime.    Historical Provider, MD  ibuprofen (ADVIL,MOTRIN) 800 MG tablet Take 1 tablet  (800 mg total) by mouth 3 (three) times daily. 09/23/15   Zachary FinnerErin O'Malley, PA-C  levofloxacin (LEVAQUIN) 500 MG tablet Take 1 tablet (500 mg total) by mouth daily. 05/02/15   Zachary HawStephen A Beese, MD  nadolol (CORGARD) 80 MG tablet Take 80 mg by mouth daily.    Historical Provider, MD    Family History Family History  Problem Relation Age of Onset  . Migraines Mother   . Hypertension Father   . Diabetes Father     Social History Social History  Substance Use Topics  . Smoking status: Current Every Day Smoker  . Smokeless tobacco: Not on file  . Alcohol use Yes     Allergies   Septra [sulfamethoxazole-trimethoprim]   Review of Systems Review of Systems  Constitutional: Negative for chills and fever.  HENT: Negative for rhinorrhea.   Respiratory: Negative for cough.   Cardiovascular: Positive for chest pain. Negative for leg swelling.  Musculoskeletal: Negative for myalgias (leg pain).  Neurological: Positive for dizziness.  All other systems reviewed and are negative.    Physical Exam Updated Vital Signs BP 130/75 (BP Location: Left Arm)   Pulse 90   Temp 97.5 F (36.4 C) (Oral)   Resp 20   Ht 5\' 7"  (1.702 m)   Wt 167 lb (75.8 kg)   SpO2 100%   BMI 26.16 kg/m   Physical Exam  Constitutional: He is oriented to person, place, and time. He appears well-developed and well-nourished. No distress.  HENT:  Head: Normocephalic  and atraumatic.  Right Ear: External ear normal.  Left Ear: External ear normal.  Nose: Nose normal.  Mouth/Throat: Oropharynx is clear and moist. No oropharyngeal exudate.  Eyes: Conjunctivae and EOM are normal. Pupils are equal, round, and reactive to light.  Neck: Normal range of motion. Neck supple.  Pulmonary/Chest: No stridor. No respiratory distress.  Abdominal: Soft. There is no tenderness. There is no rebound and no guarding.  Musculoskeletal: He exhibits no edema.  Neurological: He is alert and oriented to person, place, and time. He  displays normal reflexes. No cranial nerve deficit. He exhibits normal muscle tone. Coordination normal.  Skin: Skin is warm. No rash noted. He is not diaphoretic. No erythema.     ED Treatments / Results   DIAGNOSTIC STUDIES: Oxygen Saturation is 100% on RA, normal by my interpretation.    COORDINATION OF CARE: 8:56 PM Discussed treatment plan with pt at bedside which includes labs, chest imaging and EKG and pt agreed to plan.    Labs (all labs ordered are listed, but only abnormal results are displayed) Labs Reviewed  CBC WITH DIFFERENTIAL/PLATELET  BASIC METABOLIC PANEL  TROPONIN I  TROPONIN I  D-DIMER, QUANTITATIVE (NOT AT [facility])    EKG  EKG Interpretation  Date/Time:  Thursday October 21 2016 20:04:31 EST Ventricular Rate:  78 PR Interval:  114 QRS Duration: 100 QT Interval:  356 QTC Calculation: 405 R Axis:   90 Text Interpretation:  Normal sinus rhythm Rightward axis Borderline ECG NO prior ECG for comparison.  No STEMI Confirmed by Rush Landmark MD, CHRISTOPHER [phone]) on 10/22/2016 12:21:14 AM       Radiology Dg Chest 2 View  Result Date: 10/21/2016 CLINICAL DATA:  Central substernal chest pain and heaviness EXAM: CHEST  2 VIEW COMPARISON:  None. FINDINGS: The heart size and mediastinal contours are within normal limits. Both lungs are clear. The visualized skeletal structures are unremarkable. IMPRESSION: No active cardiopulmonary disease. Electronically Signed   By: Zachary Evans M.D.   On: 10/21/2016 20:45    Procedures Procedures (including critical care time)  Medications Ordered in ED Medications  aspirin chewable tablet 324 mg (not administered)     Initial Impression / Assessment and Plan / ED Course  I have reviewed the triage vital signs and the nursing notes.  Pertinent labs & imaging results that were available during my care of the patient were reviewed by me and considered in my medical decision making (see chart for details).     Zachary Evans  is a 26 y.o. male who presents to the Emergency Department complaining of 9/10, sudden onset, centralized chest pain onset PTA.  History and exam are seen above.  Patient had completely unremarkable exam aside from some mild chest tenderness. Lungs were clear, abdomen nontender. Symmetric pulses. No lower extremity tenderness or edema.  EKG shows no evidence of acute ischemia. Chest x-ray showed no pneumonia. Troponins negative times two and D dimer negative.  Do not feel patient has a cardiac cause of chest pain at this time. Heart score calculated as a one. With two negative troponins, patient had very low, less than 1% risk of MACE. With negative the number, do not feel this is pulmonary motion.  Patient reassured of findings. Patient informed that symptoms are likely secondary to a musculoskeletal type pain versus anxiety. Patient denied significant anxiety. Patient will be given Flexeril to try and alleviate possible musculoskeletal discomfort.  Patient instructed to follow up with a PCP for further management. Patient had no  other questions or concerns and understood return precautions for return of symptoms. Patient discharged in good condition with resolution of symptoms.    Final Clinical Impressions(s) / ED Diagnoses   Final diagnoses:  Chest pain, unspecified type    New Prescriptions Discharge Medication List as of 10/22/2016 12:31 AM    START taking these medications   Details  cyclobenzaprine (FLEXERIL) 10 MG tablet Take 1 tablet (10 mg total) by mouth 2 (two) times daily as needed for muscle spasms., Starting Fri 10/22/2016, Print        I personally performed the services described in this documentation, which was scribed in my presence. The recorded information has been reviewed and is accurate.   Clinical Impression: 1. Chest pain, unspecified type     Disposition: Discharge  Condition: Good  I have discussed the results, Dx and Tx plan with the pt(& family if  present). He/she/they expressed understanding and agree(s) with the plan. Discharge instructions discussed at great length. Strict return precautions discussed and pt &/or family have verbalized understanding of the instructions. No further questions at time of discharge.    Discharge Medication List as of 10/22/2016 12:31 AM    START taking these medications   Details  cyclobenzaprine (FLEXERIL) 10 MG tablet Take 1 tablet (10 mg total) by mouth 2 (two) times daily as needed for muscle spasms., Starting Fri 10/22/2016, Print        Follow Up: Hyman Bower, MD [address] [place] [address] [phone]     [facility] [place] [facility] [address] [id number] [place] [address] [phone]  If symptoms worsen      Zachary Scales, MD 10/22/16 1353

## 2016-10-22 LAB — TROPONIN I: Troponin I: <0.03 ng/mL (ref <0.03)

## 2016-10-22 MED ORDER — CYCLOBENZAPRINE HCL 10 MG PO TABS: 10.0000 mg | ORAL_TABLET | Freq: Two times a day (BID) | ORAL | 0 refills | Status: AC | PRN

## 2016-10-22 NOTE — Discharge Instructions (Signed)
Please follow-up with a PCP for further management of her symptoms. If any symptoms worsen, please return to the nearest emergency department. Please take your muscle relaxant as needed to help with the likely muscle pain in your chest.

## 2018-06-19 IMAGING — CR DG CHEST 2V
2 series · 2 of 2 positions shown · non-contrast
Comparison: None.

CLINICAL DATA: Central substernal chest pain and heaviness

EXAM:
CHEST  2 VIEW

[w chest pa]
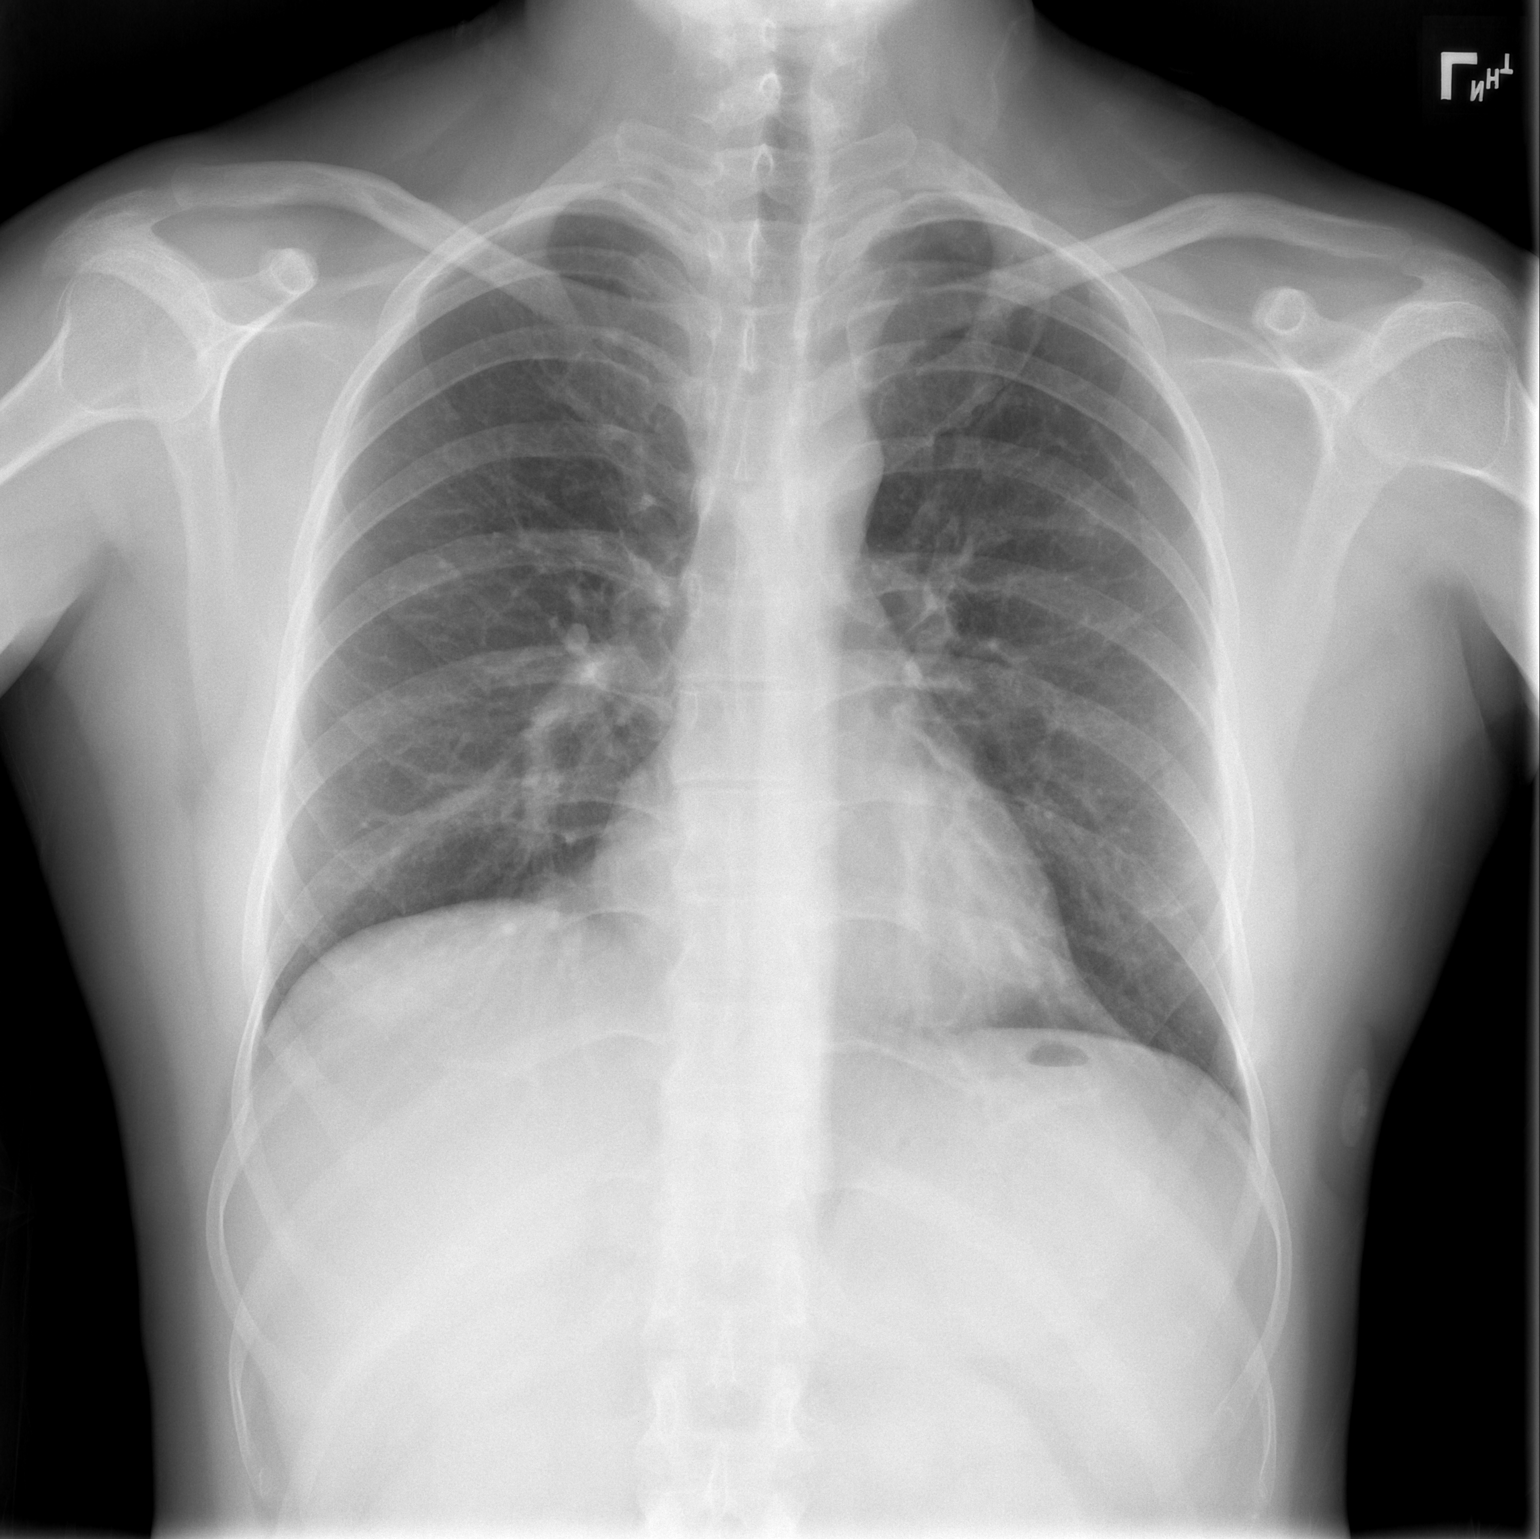

[w chest lat]
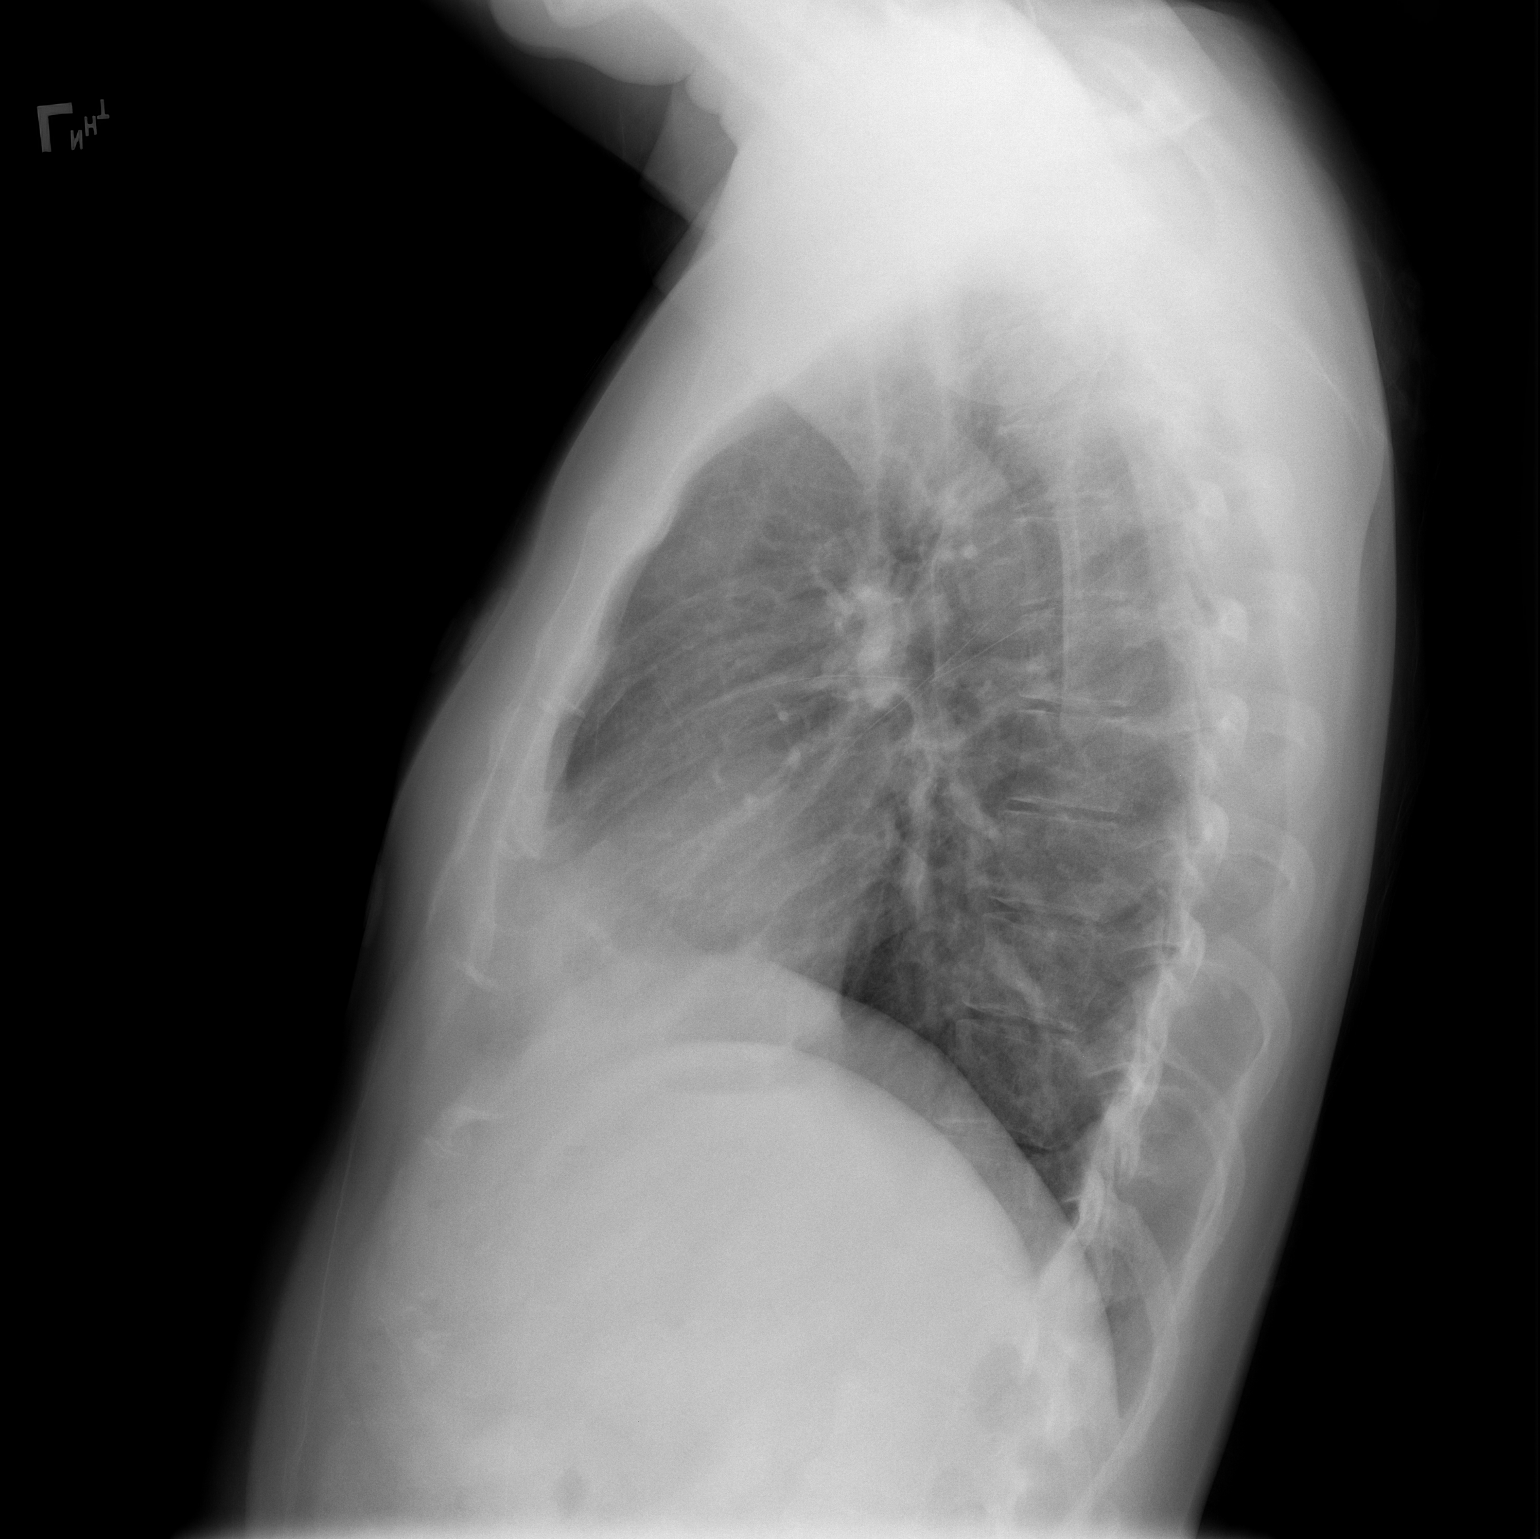

[2 of 2 positions shown; findings below may reference images not displayed]

FINDINGS: The heart size and mediastinal contours are within normal limits.
Both lungs are clear. The visualized skeletal structures are
unremarkable.
IMPRESSION: No active cardiopulmonary disease.

## 2018-12-31 ENCOUNTER — Emergency Department (HOSPITAL_BASED_OUTPATIENT_CLINIC_OR_DEPARTMENT_OTHER): Payer: 59

## 2018-12-31 ENCOUNTER — Encounter (HOSPITAL_BASED_OUTPATIENT_CLINIC_OR_DEPARTMENT_OTHER): Payer: Self-pay | Admitting: *Deleted

## 2018-12-31 ENCOUNTER — Other Ambulatory Visit: Payer: Self-pay

## 2018-12-31 ENCOUNTER — Emergency Department (HOSPITAL_BASED_OUTPATIENT_CLINIC_OR_DEPARTMENT_OTHER)
Admission: EM | Admit: 2018-12-31 | Discharge: 2018-12-31 | Disposition: A | Discharge status: Home / Self Care | Payer: 59 | Attending: Emergency Medicine | Admitting: Emergency Medicine

## 2018-12-31 DIAGNOSIS — Z87891 Personal history of nicotine dependence: Secondary | ICD-10-CM | POA: Diagnosis not present

## 2018-12-31 DIAGNOSIS — R1013 Epigastric pain: Secondary | ICD-10-CM

## 2018-12-31 DIAGNOSIS — Z79899 Other long term (current) drug therapy: Secondary | ICD-10-CM | POA: Insufficient documentation

## 2018-12-31 DIAGNOSIS — R109 Unspecified abdominal pain: Secondary | ICD-10-CM | POA: Diagnosis present

## 2018-12-31 LAB — COMPREHENSIVE METABOLIC PANEL
ALT: 39 U/L (ref 0–44)
AST: 38 U/L (ref 15–41)
Albumin: 4.4 g/dL (ref 3.5–5.0)
Alkaline Phosphatase: 64 U/L (ref 38–126)
Anion gap: 7 (ref 5–15)
BUN: 15 mg/dL (ref 6–20)
CO2: 24 mmol/L (ref 22–32)
Calcium: 9.2 mg/dL (ref 8.9–10.3)
Chloride: 106 mmol/L (ref 98–111)
Creatinine, Ser: 0.83 mg/dL (ref 0.61–1.24)
GFR calc Af Amer: >60 mL/min (ref >60)
GFR calc non Af Amer: >60 mL/min (ref >60)
Glucose, Bld: 105 mg/dL — ABNORMAL HIGH (ref 70–99)
Potassium: 3.5 mmol/L (ref 3.5–5.1)
Sodium: 137 mmol/L (ref 135–145)
Total Bilirubin: 0.4 mg/dL (ref 0.3–1.2)
Total Protein: 7.2 g/dL (ref 6.5–8.1)

## 2018-12-31 LAB — URINALYSIS, ROUTINE W REFLEX MICROSCOPIC
Bilirubin Urine: NEGATIVE
Glucose, UA: NEGATIVE mg/dL
Ketones, ur: NEGATIVE mg/dL
Leukocytes,Ua: NEGATIVE
Nitrite: NEGATIVE
Protein, ur: NEGATIVE mg/dL
Specific Gravity, Urine: 1.015 (ref 1.005–1.030)
pH: 7.5 (ref 5.0–8.0)

## 2018-12-31 LAB — CBC WITH DIFFERENTIAL/PLATELET
Abs Immature Granulocytes: 0.02 10*3/uL (ref 0.00–0.07)
Basophils Absolute: 0 10*3/uL (ref 0.0–0.1)
Basophils Relative: 0 %
Eosinophils Absolute: 0.1 10*3/uL (ref 0.0–0.5)
Eosinophils Relative: 1 %
HCT: 42.1 % (ref 39.0–52.0)
Hemoglobin: 13.5 g/dL (ref 13.0–17.0)
Immature Granulocytes: 0 %
Lymphocytes Relative: 41 %
Lymphs Abs: 2.8 10*3/uL (ref 0.7–4.0)
MCH: 28.1 pg (ref 26.0–34.0)
MCHC: 32.1 g/dL (ref 30.0–36.0)
MCV: 87.5 fL (ref 80.0–100.0)
Monocytes Absolute: 0.5 10*3/uL (ref 0.1–1.0)
Monocytes Relative: 7 %
Neutro Abs: 3.5 10*3/uL (ref 1.7–7.7)
Neutrophils Relative %: 51 %
Platelets: 233 10*3/uL (ref 150–400)
RBC: 4.81 MIL/uL (ref 4.22–5.81)
RDW: 12.9 % (ref 11.5–15.5)
WBC: 6.8 10*3/uL (ref 4.0–10.5)
nRBC: 0 % (ref 0.0–0.2)

## 2018-12-31 LAB — URINALYSIS, MICROSCOPIC (REFLEX)

## 2018-12-31 LAB — LIPASE, BLOOD: Lipase: 27 U/L (ref 11–51)

## 2018-12-31 MED ORDER — SODIUM CHLORIDE 0.9 % IV BOLUS
1000.0000 mL | Freq: Once | INTRAVENOUS | Status: AC
  Administered 2018-12-31: 1000 mL via INTRAVENOUS

## 2018-12-31 MED ORDER — ONDANSETRON HCL 4 MG/2ML IJ SOLN
4.0000 mg | Freq: Once | INTRAMUSCULAR | Status: AC
  Administered 2018-12-31: 21:00:00 4 mg via INTRAVENOUS
  Filled 2018-12-31: qty 2

## 2018-12-31 MED ORDER — IOHEXOL 300 MG/ML  SOLN
100.0000 mL | Freq: Once | INTRAMUSCULAR | Status: AC | PRN
  Administered 2018-12-31: 100 mL via INTRAVENOUS

## 2018-12-31 MED ORDER — ALUM & MAG HYDROXIDE-SIMETH 200-200-20 MG/5ML PO SUSP
30.0000 mL | Freq: Once | ORAL | Status: AC
  Administered 2018-12-31: 30 mL via ORAL
  Filled 2018-12-31: qty 30

## 2018-12-31 MED ORDER — SUCRALFATE 1 G PO TABS: 1.0000 g | ORAL_TABLET | Freq: Three times a day (TID) | ORAL | 0 refills | Status: AC | PRN

## 2018-12-31 MED ORDER — LIDOCAINE VISCOUS HCL 2 % MT SOLN
15.0000 mL | Freq: Once | OROMUCOSAL | Status: AC
  Administered 2018-12-31: 22:00:00 15 mL via ORAL
  Filled 2018-12-31: qty 15

## 2018-12-31 MED ORDER — FAMOTIDINE IN NACL 20-0.9 MG/50ML-% IV SOLN
20.0000 mg | Freq: Once | INTRAVENOUS | Status: AC
  Administered 2018-12-31: 21:00:00 20 mg via INTRAVENOUS
  Filled 2018-12-31: qty 50

## 2018-12-31 NOTE — ED Triage Notes (Signed)
Pt reports sharp upper abd pain 45 mins pta. Similar episodes in the past. He has an appointment with GI at the end of this month

## 2018-12-31 NOTE — ED Provider Notes (Signed)
[facility] EMERGENCY DEPARTMENT Provider Note   CSN: [id number] Arrival date & time: 12/31/18  2050    History   Chief Complaint Chief Complaint  Patient presents with  . Abdominal Pain    HPI Zachary Evans is a 28 y.o. male here presenting with abdominal pain.  Patient states that he has intermittent epigastric pain for the last several weeks.  About 2 weeks ago, his pain suddenly got worse so he called EMS and was checked out but was not sent to the hospital.  He states that over the last 2 weeks, he has intermittent epigastric pain after he eats.  He feels nauseated and has occasional vomiting.  He denies drinking alcohol or overdosing on Tylenol.  He denies any fevers or chills or cough or sick contacts.  Patient states that he has history of migraines and he is on prophylactic medicines for migraines.  Patient is scheduled to see GI doctor in 2 weeks and never had a endoscopy previously.     The history is provided by the patient.    Past Medical History:  Diagnosis Date  . Migraine     There are no active problems to display for this patient.   History reviewed. No pertinent surgical history.      Home Medications    Prior to Admission medications   Medication Sig Start Date End Date Taking? Authorizing Provider  amitriptyline (ELAVIL) 10 MG tablet Take 30 mg by mouth at bedtime.    [provider]  cyclobenzaprine (FLEXERIL) 10 MG tablet Take 1 tablet (10 mg total) by mouth 2 (two) times daily as needed for muscle spasms. 10/22/16   Tegeler, Canary Brim, MD  ibuprofen (ADVIL,MOTRIN) 800 MG tablet Take 1 tablet (800 mg total) by mouth 3 (three) times daily. 09/23/15   Lurene Shadow, PA-C  levofloxacin (LEVAQUIN) 500 MG tablet Take 1 tablet (500 mg total) by mouth daily. 05/02/15   Lattie Haw, MD  nadolol (CORGARD) 80 MG tablet Take 80 mg by mouth daily.    [provider]    Family History Family History  Problem Relation Age of  Onset  . Migraines Mother   . Hypertension Father   . Diabetes Father     Social History Social History   Tobacco Use  . Smoking status: Former Smoker    Types: Cigarettes  . Smokeless tobacco: Never Used  Substance Use Topics  . Alcohol use: Not Currently  . Drug use: No     Allergies   Septra [sulfamethoxazole-trimethoprim]   Review of Systems Review of Systems  Gastrointestinal: Positive for abdominal pain.  All other systems reviewed and are negative.    Physical Exam Updated Vital Signs BP (!) 138/97 (BP Location: Right Arm)   Pulse (!) 56   Temp 98.4 F (36.9 C) (Oral)   Resp 18   Ht 5\' 7"  (1.702 m)   Wt 74.8 kg   SpO2 100%   BMI 25.84 kg/m   Physical Exam Vitals signs and nursing note reviewed.  HENT:     Head: Normocephalic.     Mouth/Throat:     Mouth: Mucous membranes are moist.  Eyes:     Extraocular Movements: Extraocular movements intact.  Cardiovascular:     Rate and Rhythm: Normal rate and regular rhythm.  Pulmonary:     Effort: Pulmonary effort is normal.     Breath sounds: Normal breath sounds.  Abdominal:     General: Abdomen is flat. Bowel sounds  are normal.     Comments: + epigastric tenderness, no RUQ tenderness   Skin:    General: Skin is warm.     Capillary Refill: Capillary refill takes less than 2 seconds.  Neurological:     General: No focal deficit present.     Mental Status: He is alert and oriented to person, place, and time.  Psychiatric:        Mood and Affect: Mood normal.        Behavior: Behavior normal.      ED Treatments / Results  Labs (all labs ordered are listed, but only abnormal results are displayed) Labs Reviewed  CBC WITH DIFFERENTIAL/PLATELET  COMPREHENSIVE METABOLIC PANEL  LIPASE, BLOOD  URINALYSIS, ROUTINE W REFLEX MICROSCOPIC    EKG None  Radiology No results found.  Procedures Procedures (including critical care time)  Medications Ordered in ED Medications  famotidine (PEPCID)  IVPB 20 mg premix (has no administration in time range)  sodium chloride 0.9 % bolus 1,000 mL (1,000 mLs Intravenous New Bag/Given 12/31/18 2122)  ondansetron (ZOFRAN) injection 4 mg (4 mg Intravenous Given 12/31/18 2126)     Initial Impression / Assessment and Plan / ED Course  I have reviewed the triage vital signs and the nursing notes.  Pertinent labs & imaging results that were available during my care of the patient were reviewed by me and considered in my medical decision making (see chart for details).       Zachary Evans is a 28 y.o. male here with epigastric pain.  Consider gastritis versus gastric ulcer versus biliary colic. Will get labs, UA, CT ab/pel. Will give GI cocktail, pepcid.   10:38 PM Labs unremarkable. LFTs nl. UA nl. CT unremarkable. Likely gastritis vs gastric ulcer. He is on prilosec, will add pepcid, carafate. He has GI appointment in 2 weeks and if he has persistent pain, recommend endoscopy    Final Clinical Impressions(s) / ED Diagnoses   Final diagnoses:  None    ED Discharge Orders    None       Charlynne PanderYao, Denika Krone Hsienta, MD 12/31/18 2240

## 2018-12-31 NOTE — ED Notes (Signed)
ED Provider at bedside. 

## 2018-12-31 NOTE — ED Notes (Signed)
Patient transported to CT 

## 2018-12-31 NOTE — Discharge Instructions (Addendum)
You may have gastritis or gastric ulcer.   Continue prilosec daily.   Take pepcid 20 mg twice daily as needed.   Add carafate three times daily as needed.   Avoid spicy food.   See your GI doctor as scheduled. Consider endoscopy if you have persistent pain   Return to ER if you have worse abdominal pain, vomiting, fever.

## 2018-12-31 NOTE — ED Notes (Signed)
Pt understood dc material. NAD noted. Script given at dc. All questions answered to satisfaction. Pt escorted to check out counter 

## 2020-08-28 IMAGING — CT CT ABDOMEN AND PELVIS WITH CONTRAST
2 of 4 series · 15 of 46 positions shown, 17 images · IV contrast (APPLIED)
Comparison: 09/03/2013

CLINICAL DATA: Sharp upper abdominal pain.

EXAM:
CT ABDOMEN AND PELVIS WITH CONTRAST
TECHNIQUE: Multidetector CT imaging of the abdomen and pelvis was performed
using the standard protocol following bolus administration of
intravenous contrast.
CONTRAST:  100mL OMNIPAQUE IOHEXOL 300 MG/ML  SOLN

[Series 2: axial st · axial · 0.73mm/px · z∈[-350,+105]mm · 12 of 101 slices shown, 14 images]
[im 5/101  soft-tissue]
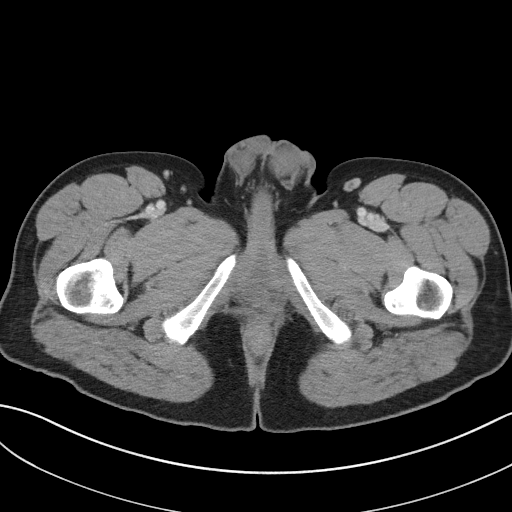
[im 5/101  bone]
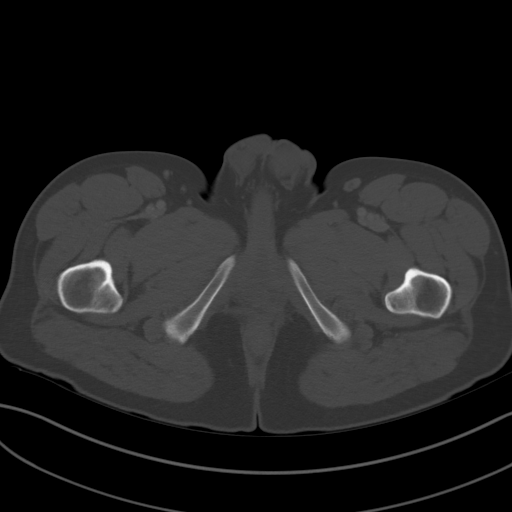
[im 13/101  soft-tissue]
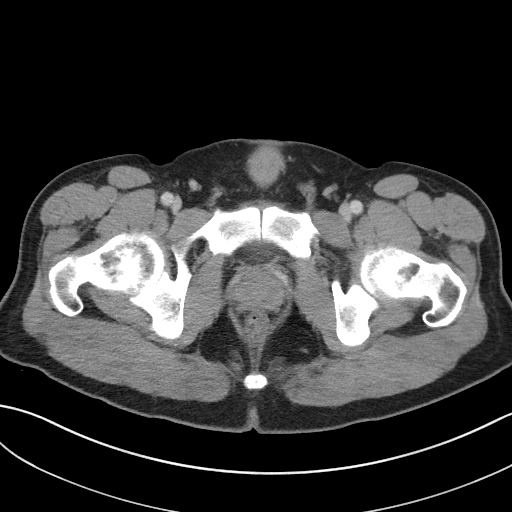
[im 21/101  soft-tissue]
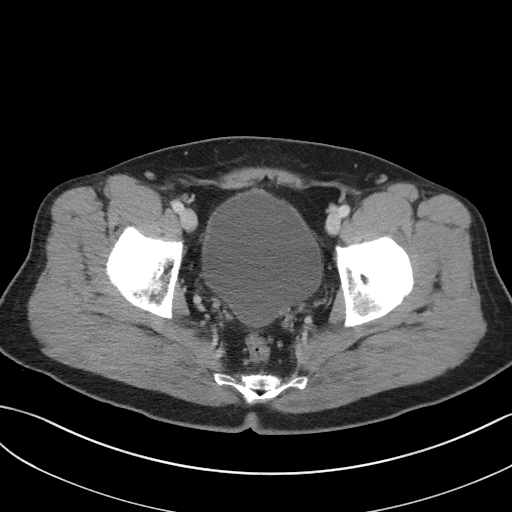
[im 30/101  soft-tissue]
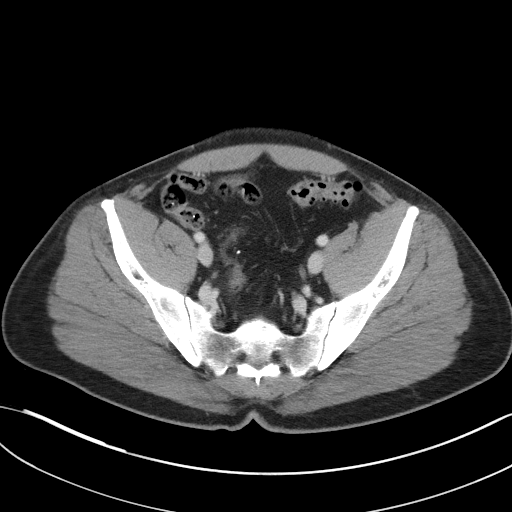
[im 38/101  soft-tissue]
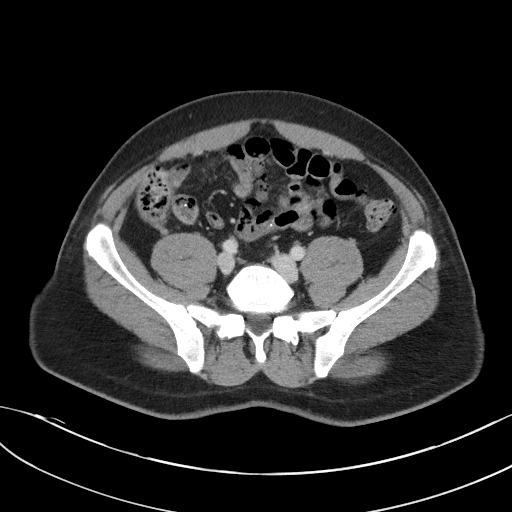
[im 46/101  soft-tissue]
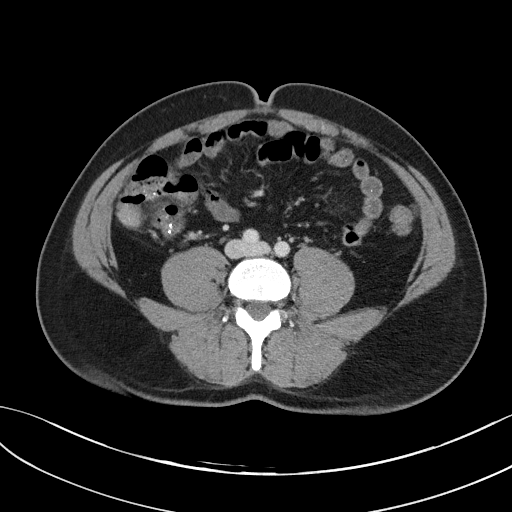
[im 55/101  soft-tissue]
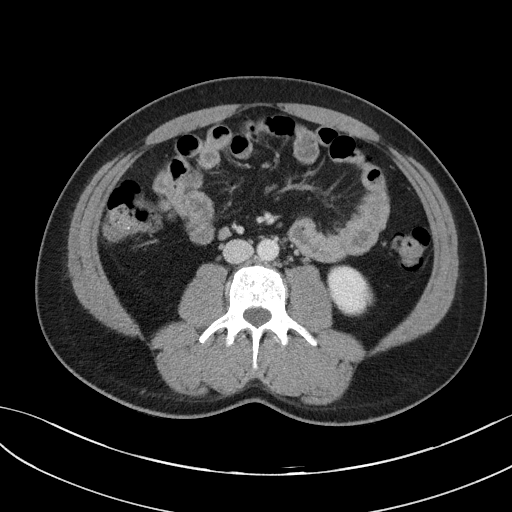
[im 63/101  soft-tissue]
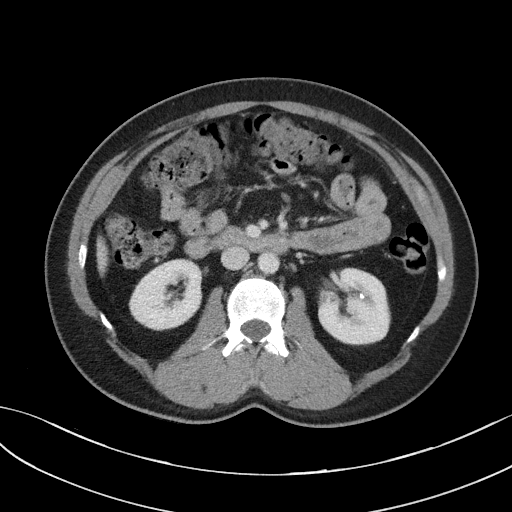
[im 71/101  soft-tissue]
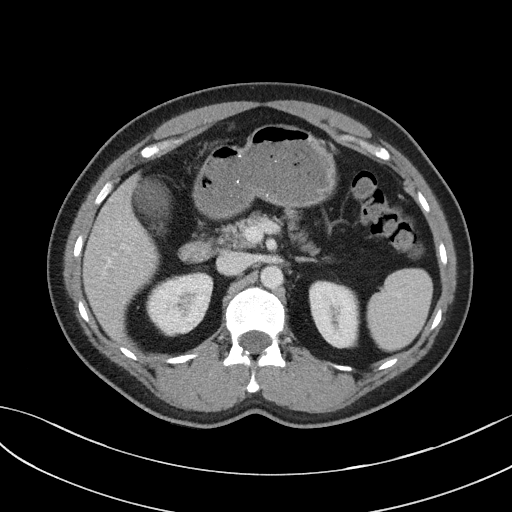
[im 71/101  bone]
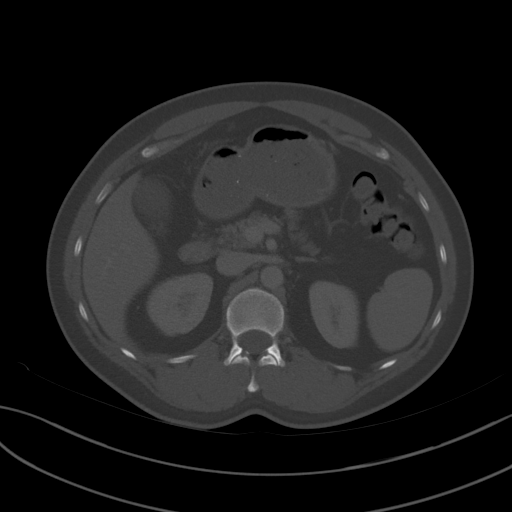
[im 80/101  soft-tissue]
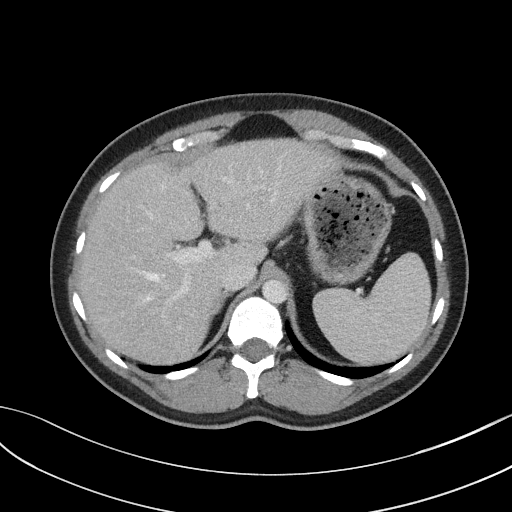
[im 88/101  soft-tissue]
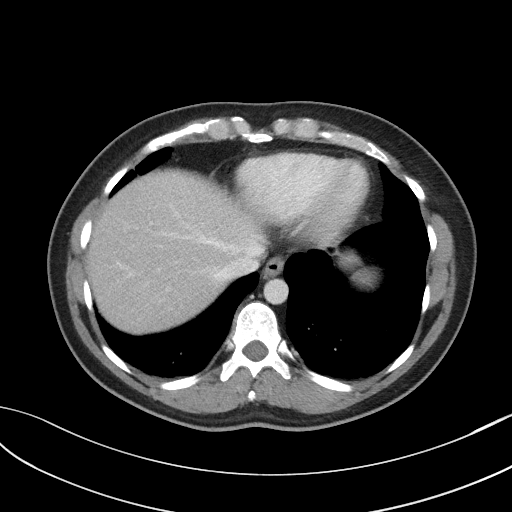
[im 96/101  soft-tissue]
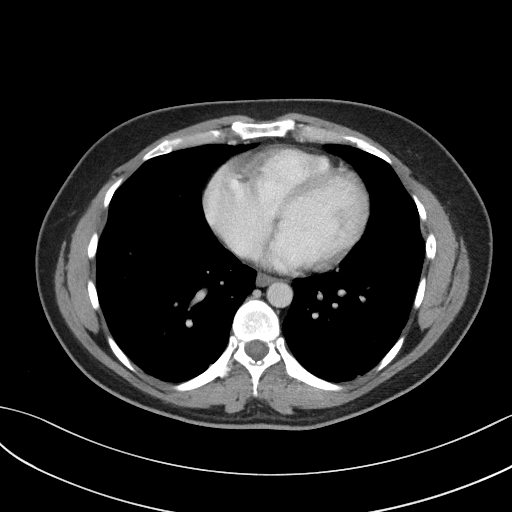

[Series 5: coronal st · coronal · 0.73mm/px · 3 of 101 slices shown]
[im 34/101  soft-tissue]
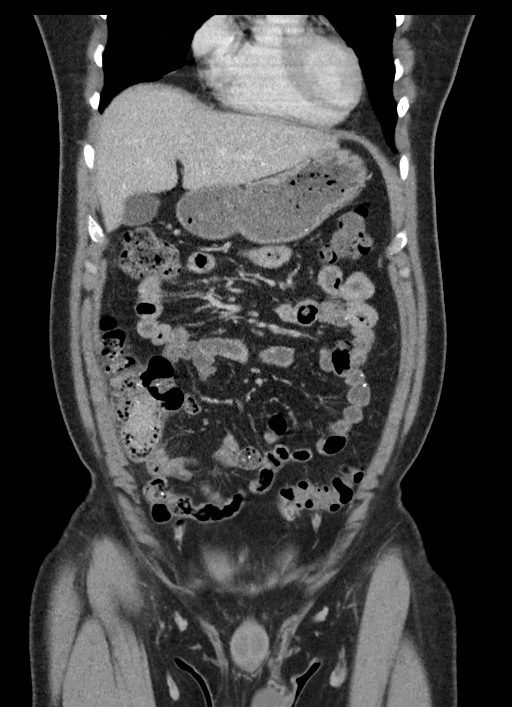
[im 45/101  soft-tissue]
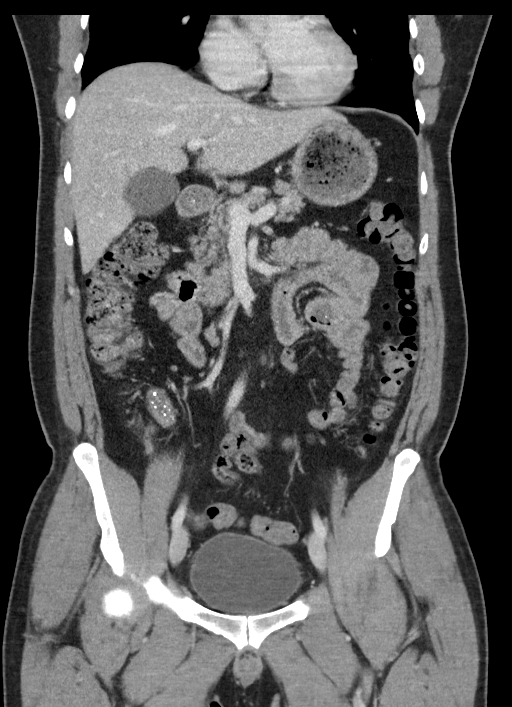
[im 56/101  soft-tissue]
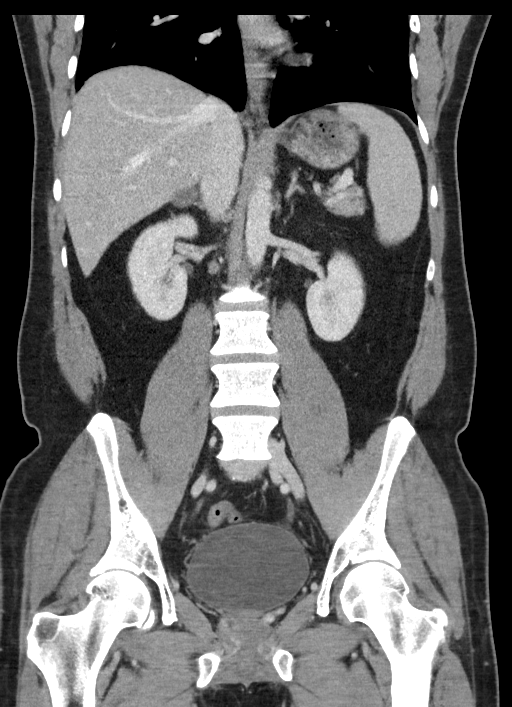

[15 of 46 positions shown; findings below may reference images not displayed]

FINDINGS: Lower chest: Unremarkable

Hepatobiliary: No suspicious focal abnormality within the liver
parenchyma. There is no evidence for gallstones, gallbladder wall
thickening, or pericholecystic fluid. No intrahepatic or
extrahepatic biliary dilation.

Pancreas: No focal mass lesion. No dilatation of the main duct. No
intraparenchymal cyst. No peripancreatic edema.

Spleen: No splenomegaly. No focal mass lesion.

Adrenals/Urinary Tract: No adrenal nodule or mass. Right kidney
unremarkable. 4 mm nonobstructing stone identified interpolar left
kidney. No evidence for hydroureter. The urinary bladder appears
normal for the degree of distention.

Stomach/Bowel: Stomach is unremarkable. No gastric wall thickening.
No evidence of outlet obstruction. Duodenum is normally positioned
as is the ligament of Treitz. No small bowel wall thickening. No
small bowel dilatation. The terminal ileum is normal. The appendix
is normal. No gross colonic mass. No colonic wall thickening.

Vascular/Lymphatic: No abdominal aortic aneurysm. No abdominal
aortic atherosclerotic calcification. There is no gastrohepatic or
hepatoduodenal ligament lymphadenopathy. No intraperitoneal or
retroperitoneal lymphadenopathy. No pelvic sidewall lymphadenopathy.

Reproductive: The prostate gland and seminal vesicles are
unremarkable.

Other: No intraperitoneal free fluid.

Musculoskeletal: No worrisome lytic or sclerotic osseous
abnormality.
IMPRESSION: 1. No acute findings in the abdomen or pelvis. No findings to
explain the patient's history of pain.
2. Nonobstructing 4 mm left renal stone.
